# Patient Record
Sex: Male | Born: 2000 | Race: White | Hispanic: No | Marital: Single
Health system: Southern US, Community
[De-identification: ages and names within clinical notes are randomized; demographics above are authoritative.]

## PROBLEM LIST (undated history)

## (undated) HISTORY — PX: TONSILLECTOMY: SUR1361

---
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
